# Patient Record
Sex: Male | Born: 1985 | Race: White | Hispanic: No | Marital: Married | Smoking: Never smoker
Health system: Southern US, Community
[De-identification: ages and names within clinical notes are randomized; demographics above are authoritative.]

## PROBLEM LIST (undated history)

## (undated) DIAGNOSIS — R002 Palpitations: Secondary | ICD-10-CM

## (undated) DIAGNOSIS — I10 Essential (primary) hypertension: Secondary | ICD-10-CM

## (undated) DIAGNOSIS — F419 Anxiety disorder, unspecified: Secondary | ICD-10-CM

## (undated) DIAGNOSIS — F32A Depression, unspecified: Secondary | ICD-10-CM

## (undated) DIAGNOSIS — F329 Major depressive disorder, single episode, unspecified: Secondary | ICD-10-CM

## (undated) HISTORY — PX: WISDOM TOOTH EXTRACTION: SHX21

---
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
## 2005-03-28 ENCOUNTER — Ambulatory Visit (HOSPITAL_COMMUNITY): Admission: RE | Admit: 2005-03-28 | Discharge: 2005-03-28 | Payer: Self-pay | Admitting: Orthopedic Surgery

## 2005-03-28 ENCOUNTER — Ambulatory Visit (HOSPITAL_BASED_OUTPATIENT_CLINIC_OR_DEPARTMENT_OTHER): Admission: RE | Admit: 2005-03-28 | Discharge: 2005-03-28 | Payer: Self-pay | Admitting: Orthopedic Surgery

## 2005-07-08 HISTORY — PX: SHOULDER SURGERY: SHX246

## 2012-08-12 ENCOUNTER — Emergency Department: Payer: Self-pay | Admitting: Emergency Medicine

## 2012-08-12 LAB — COMPREHENSIVE METABOLIC PANEL
Anion Gap: 8 (ref 7–16)
BUN: 8 mg/dL (ref 7–18)
Chloride: 104 mmol/L (ref 98–107)
Creatinine: 0.73 mg/dL (ref 0.60–1.30)
Osmolality: 275 (ref 275–301)
Potassium: 3.7 mmol/L (ref 3.5–5.1)
SGOT(AST): 20 U/L (ref 15–37)
SGPT (ALT): 31 U/L (ref 12–78)
Sodium: 138 mmol/L (ref 136–145)
Total Protein: 7.6 g/dL (ref 6.4–8.2)

## 2012-08-12 LAB — URINALYSIS, COMPLETE
Bacteria: NONE SEEN
Bilirubin,UR: NEGATIVE
Blood: NEGATIVE
Glucose,UR: NEGATIVE mg/dL (ref 0–75)
Leukocyte Esterase: NEGATIVE
Nitrite: NEGATIVE
Ph: 7 (ref 4.5–8.0)
RBC,UR: NONE SEEN /HPF (ref 0–5)
Squamous Epithelial: NONE SEEN
WBC UR: NONE SEEN /HPF (ref 0–5)

## 2012-08-12 LAB — CBC
HGB: 14.9 g/dL (ref 13.0–18.0)
MCH: 26.1 pg (ref 26.0–34.0)
MCHC: 33 g/dL (ref 32.0–36.0)
MCV: 79 fL — ABNORMAL LOW (ref 80–100)
Platelet: 213 10*3/uL (ref 150–440)
RDW: 12.7 % (ref 11.5–14.5)

## 2013-08-21 ENCOUNTER — Emergency Department: Payer: Self-pay | Admitting: Emergency Medicine

## 2013-08-21 LAB — COMPREHENSIVE METABOLIC PANEL
ALK PHOS: 84 U/L
ALT: 28 U/L (ref 12–78)
AST: 25 U/L (ref 15–37)
Albumin: 4.7 g/dL (ref 3.4–5.0)
Anion Gap: 7 (ref 7–16)
BUN: 12 mg/dL (ref 7–18)
Bilirubin,Total: 0.4 mg/dL (ref 0.2–1.0)
CALCIUM: 10 mg/dL (ref 8.5–10.1)
CHLORIDE: 103 mmol/L (ref 98–107)
CREATININE: 1.03 mg/dL (ref 0.60–1.30)
Co2: 28 mmol/L (ref 21–32)
EGFR (African American): >60
EGFR (Non-African Amer.): >60
GLUCOSE: 125 mg/dL — AB (ref 65–99)
Osmolality: 277 (ref 275–301)
POTASSIUM: 3.9 mmol/L (ref 3.5–5.1)
Sodium: 138 mmol/L (ref 136–145)
Total Protein: 8.3 g/dL — ABNORMAL HIGH (ref 6.4–8.2)

## 2013-08-21 LAB — DRUG SCREEN, URINE
Amphetamines, Ur Screen: NEGATIVE (ref ?–1000)
BARBITURATES, UR SCREEN: NEGATIVE (ref ?–200)
Benzodiazepine, Ur Scrn: NEGATIVE (ref ?–200)
CANNABINOID 50 NG, UR ~~LOC~~: NEGATIVE (ref ?–50)
COCAINE METABOLITE, UR ~~LOC~~: NEGATIVE (ref ?–300)
MDMA (Ecstasy)Ur Screen: NEGATIVE (ref ?–500)
Methadone, Ur Screen: NEGATIVE (ref ?–300)
Opiate, Ur Screen: NEGATIVE (ref ?–300)
Phencyclidine (PCP) Ur S: NEGATIVE (ref ?–25)
Tricyclic, Ur Screen: NEGATIVE (ref ?–1000)

## 2013-08-21 LAB — CBC
HCT: 45.3 % (ref 40.0–52.0)
HGB: 15.4 g/dL (ref 13.0–18.0)
MCH: 26.8 pg (ref 26.0–34.0)
MCHC: 33.9 g/dL (ref 32.0–36.0)
MCV: 79 fL — ABNORMAL LOW (ref 80–100)
Platelet: 222 10*3/uL (ref 150–440)
RBC: 5.75 10*6/uL (ref 4.40–5.90)
RDW: 13.2 % (ref 11.5–14.5)
WBC: 9.2 10*3/uL (ref 3.8–10.6)

## 2013-08-21 LAB — TSH: Thyroid Stimulating Horm: 2.22 u[IU]/mL

## 2013-08-21 LAB — T4, FREE: FREE THYROXINE: 0.87 ng/dL (ref 0.76–1.46)

## 2013-08-21 LAB — TROPONIN I

## 2013-12-06 ENCOUNTER — Emergency Department (HOSPITAL_BASED_OUTPATIENT_CLINIC_OR_DEPARTMENT_OTHER): Payer: Self-pay

## 2013-12-06 ENCOUNTER — Emergency Department (HOSPITAL_BASED_OUTPATIENT_CLINIC_OR_DEPARTMENT_OTHER)
Admission: EM | Admit: 2013-12-06 | Discharge: 2013-12-06 | Disposition: A | Discharge status: Home / Self Care | Payer: Self-pay | Attending: Emergency Medicine | Admitting: Emergency Medicine

## 2013-12-06 ENCOUNTER — Encounter (HOSPITAL_BASED_OUTPATIENT_CLINIC_OR_DEPARTMENT_OTHER): Payer: Self-pay | Admitting: Emergency Medicine

## 2013-12-06 DIAGNOSIS — Y990 Civilian activity done for income or pay: Secondary | ICD-10-CM | POA: Insufficient documentation

## 2013-12-06 DIAGNOSIS — Y9389 Activity, other specified: Secondary | ICD-10-CM | POA: Insufficient documentation

## 2013-12-06 DIAGNOSIS — F3289 Other specified depressive episodes: Secondary | ICD-10-CM | POA: Insufficient documentation

## 2013-12-06 DIAGNOSIS — Y9289 Other specified places as the place of occurrence of the external cause: Secondary | ICD-10-CM | POA: Insufficient documentation

## 2013-12-06 DIAGNOSIS — S39012A Strain of muscle, fascia and tendon of lower back, initial encounter: Secondary | ICD-10-CM

## 2013-12-06 DIAGNOSIS — S335XXA Sprain of ligaments of lumbar spine, initial encounter: Secondary | ICD-10-CM | POA: Insufficient documentation

## 2013-12-06 DIAGNOSIS — X500XXA Overexertion from strenuous movement or load, initial encounter: Secondary | ICD-10-CM | POA: Insufficient documentation

## 2013-12-06 DIAGNOSIS — Z79899 Other long term (current) drug therapy: Secondary | ICD-10-CM | POA: Insufficient documentation

## 2013-12-06 DIAGNOSIS — F329 Major depressive disorder, single episode, unspecified: Secondary | ICD-10-CM | POA: Insufficient documentation

## 2013-12-06 HISTORY — DX: Depression, unspecified: F32.A

## 2013-12-06 HISTORY — DX: Major depressive disorder, single episode, unspecified: F32.9

## 2013-12-06 MED ORDER — HYDROCODONE-ACETAMINOPHEN 5-325 MG PO TABS: 1.0000 | ORAL_TABLET | ORAL | Status: DC | PRN

## 2013-12-06 MED ORDER — CYCLOBENZAPRINE HCL 5 MG PO TABS: 5.0000 mg | ORAL_TABLET | Freq: Two times a day (BID) | ORAL | Status: DC | PRN

## 2013-12-06 NOTE — Discharge Instructions (Signed)
Back Pain, Adult  Back pain is very common. The pain often gets better over time. The cause of back pain is usually not dangerous. Most people can learn to manage their back pain on their own.   HOME CARE   · Stay active. Start with short walks on flat ground if you can. Try to walk farther each day.  · Do not sit, drive, or stand in one place for more than 30 minutes. Do not stay in bed.  · Do not avoid exercise or work. Activity can help your back heal faster.  · Be careful when you bend or lift an object. Bend at your knees, keep the object close to you, and do not twist.  · Sleep on a firm mattress. Lie on your side, and bend your knees. If you lie on your back, put a pillow under your knees.  · Only take medicines as told by your doctor.  · Put ice on the injured area.  · Put ice in a plastic bag.  · Place a towel between your skin and the bag.  · Leave the ice on for 15-20 minutes, 03-04 times a day for the first 2 to 3 days. After that, you can switch between ice and heat packs.  · Ask your doctor about back exercises or massage.  · Avoid feeling anxious or stressed. Find good ways to deal with stress, such as exercise.  GET HELP RIGHT AWAY IF:   · Your pain does not go away with rest or medicine.  · Your pain does not go away in 1 week.  · You have new problems.  · You do not feel well.  · The pain spreads into your legs.  · You cannot control when you poop (bowel movement) or pee (urinate).  · Your arms or legs feel weak or lose feeling (numbness).  · You feel sick to your stomach (nauseous) or throw up (vomit).  · You have belly (abdominal) pain.  · You feel like you may pass out (faint).  MAKE SURE YOU:   · Understand these instructions.  · Will watch your condition.  · Will get help right away if you are not doing well or get worse.  Document Released: 12/11/2007 Document Revised: 09/16/2011 Document Reviewed: 11/12/2010  [employer]® Patient Information ©2014 [employer].

## 2013-12-06 NOTE — ED Provider Notes (Signed)
CSN: [id number]     Arrival date & time 12/06/13  1024 History   First MD Initiated Contact with Patient 12/06/13 1029     Chief Complaint  Patient presents with  . Back Pain     (Consider location/radiation/quality/duration/timing/severity/associated sxs/prior Treatment) HPI Comments: Pt c/o lower back pain after lifting a box and rotating while at work today. Denies numbness or weakness. No previous history of back pain. Hasn't taken anything. Pain is worse with movement. Hasn't taken anything for pain  The history is provided by the patient. No language interpreter was used.    Past Medical History  Diagnosis Date  . Depression    Past Surgical History  Procedure Laterality Date  . Shoulder surgery     History reviewed. No pertinent family history. History  Substance Use Topics  . Smoking status: Never Smoker   . Smokeless tobacco: Not on file  . Alcohol Use: No    Review of Systems  Constitutional: Negative.   Respiratory: Negative.   Cardiovascular: Negative.       Allergies  Review of patient's allergies indicates no known allergies.  Home Medications   Prior to Admission medications   Medication Sig Start Date End Date Taking? Authorizing Provider  ALPRAZolam Prudy Feeler) 1 MG tablet Take 1 mg by mouth at bedtime as needed for anxiety.   Yes Historical Provider, MD  PARoxetine (PAXIL) 10 MG tablet Take 10 mg by mouth daily.   Yes Historical Provider, MD   BP 140/73  Pulse 80  Temp(Src) 99.2 F (37.3 C) (Oral)  Resp 18  Ht 5\' 9"  (1.753 m)  Wt 260 lb (117.935 kg)  BMI 38.38 kg/m2 Physical Exam  Nursing note and vitals reviewed. Constitutional: He is oriented to person, place, and time. He appears well-developed and well-nourished.  Cardiovascular: Normal rate and regular rhythm.   Pulmonary/Chest: Effort normal and breath sounds normal.  Musculoskeletal:       Lumbar back: He exhibits tenderness and bony tenderness. He exhibits no deformity.   Neurological: He is alert and oriented to person, place, and time. Coordination normal.  Skin: Skin is warm and dry.  Psychiatric: He has a normal mood and affect.    ED Course  Procedures (including critical care time) Labs Review Labs Reviewed - No data to display  Imaging Review Dg Lumbar Spine Complete  12/06/2013   CLINICAL DATA:  Twisting injury with severe mid and low back pain.  EXAM: LUMBAR SPINE - COMPLETE 4+ VIEW  COMPARISON:  None.  FINDINGS: There is straightening of the normal lumbar lordosis without subluxation or fracture. Alignment is otherwise anatomic. Vertebral body and disc space height are maintained. No pars defects. No degenerative changes.  IMPRESSION: Straightening of the normal lumbar lordosis. No fracture or subluxation.   Electronically Signed   By: Leanna Battles M.D.   On: 12/06/2013 11:27     EKG Interpretation None      MDM   Final diagnoses:  Lumbar strain    Likely strain. Pt is neurologically intact. Will treat symptomatically with hydrocodone and flexeril    Teressa Lower, NP 12/06/13 1145

## 2013-12-06 NOTE — ED Notes (Signed)
Pt reports that he picked up a box at work and rotated.  Reports lower-mid back pain since that time.

## 2013-12-09 ENCOUNTER — Other Ambulatory Visit: Payer: Self-pay | Admitting: Sports Medicine

## 2013-12-09 DIAGNOSIS — M545 Low back pain, unspecified: Secondary | ICD-10-CM

## 2013-12-09 NOTE — ED Provider Notes (Signed)
Medical screening examination/treatment/procedure(s) were performed by non-physician practitioner and as supervising physician I was immediately available for consultation/collaboration.   EKG Interpretation None        Candyce Churn III, MD 12/09/13 1113

## 2013-12-18 ENCOUNTER — Other Ambulatory Visit: Payer: Self-pay

## 2015-12-13 IMAGING — CR DG LUMBAR SPINE COMPLETE 4+V
5 series · 5 of 5 positions shown · non-contrast
Comparison: None.

CLINICAL DATA: Twisting injury with severe mid and low back pain.

EXAM:
LUMBAR SPINE - COMPLETE 4+ VIEW

[t l-spine a.p.]
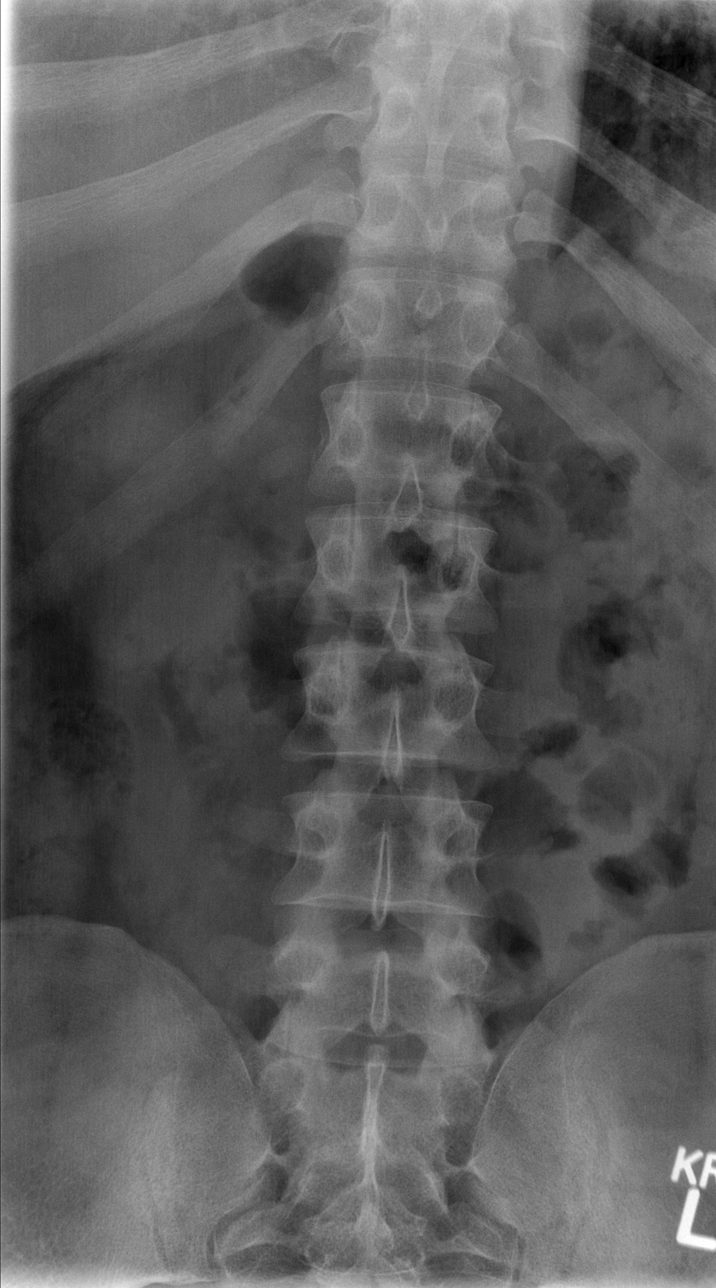

[t l-spine oblique exposure (1 of 2)]
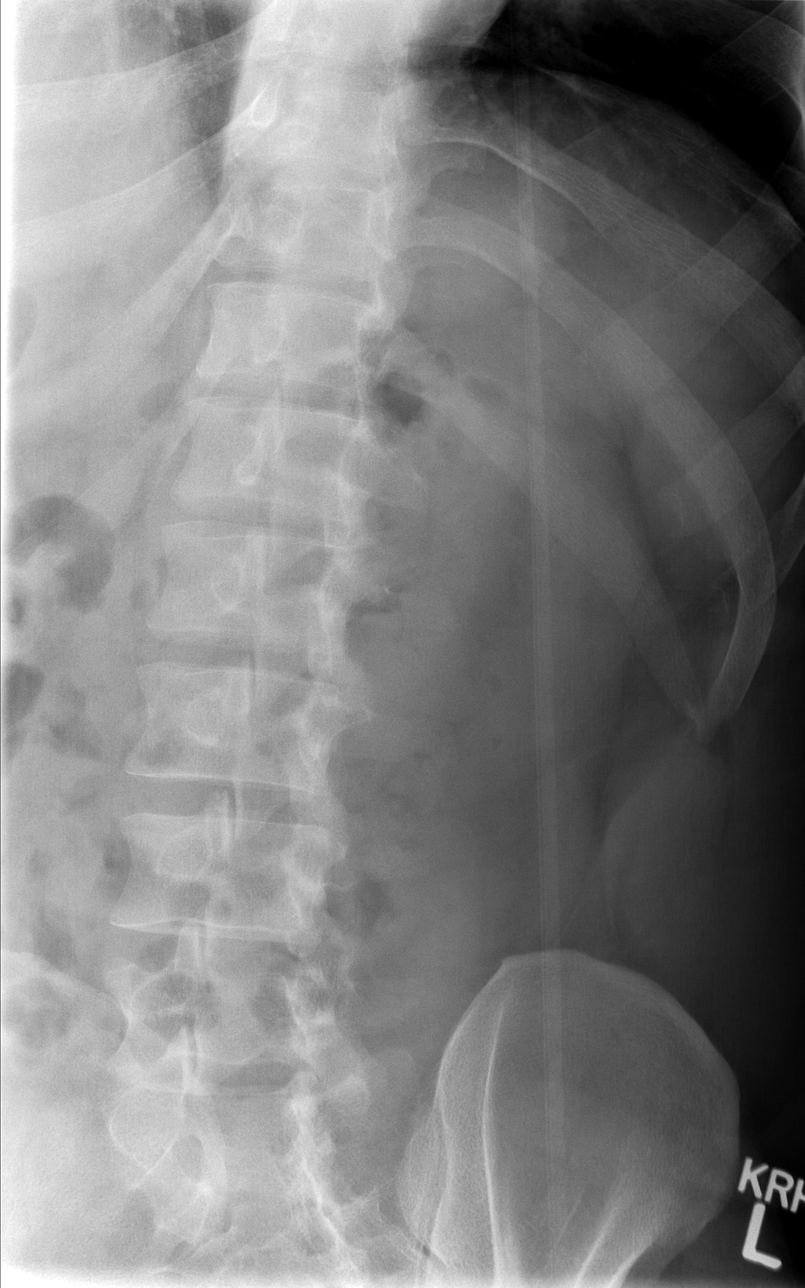

[t l-spine oblique exposure (2 of 2)]
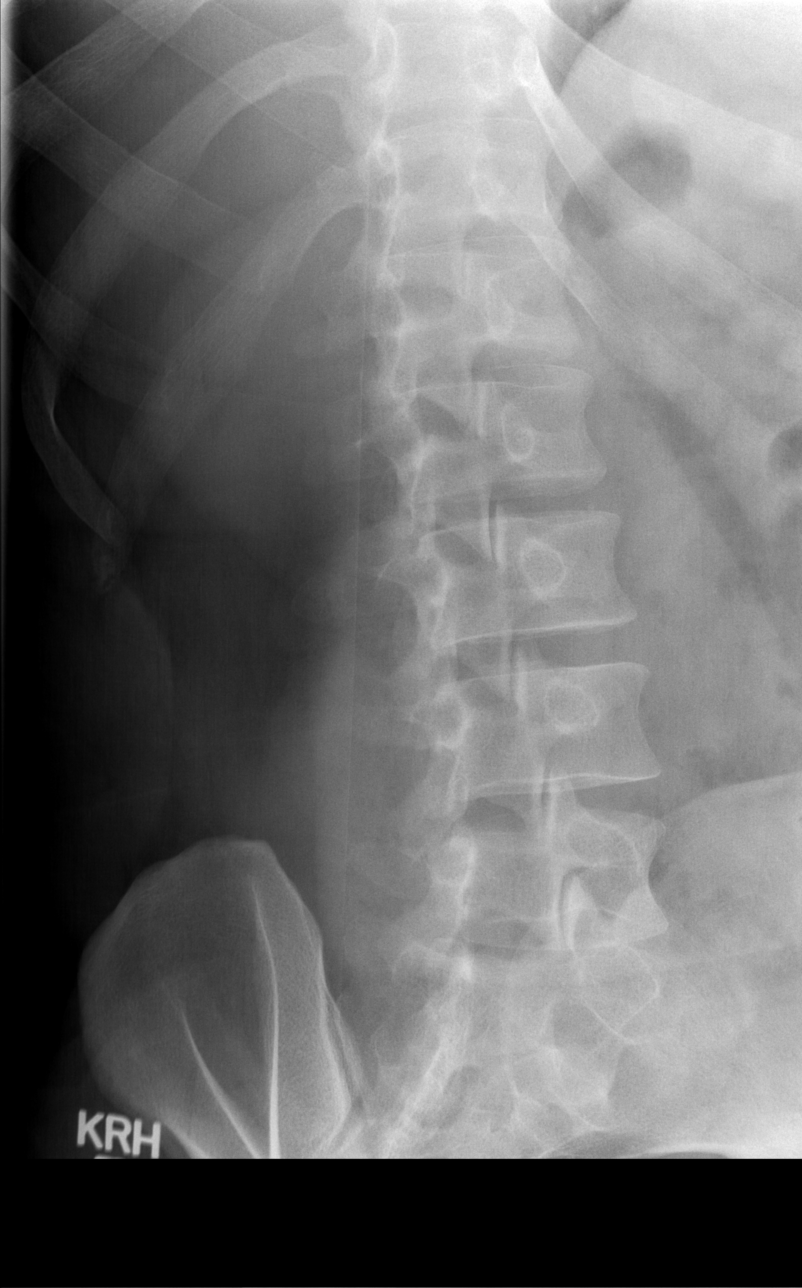

[t l-spine lat]
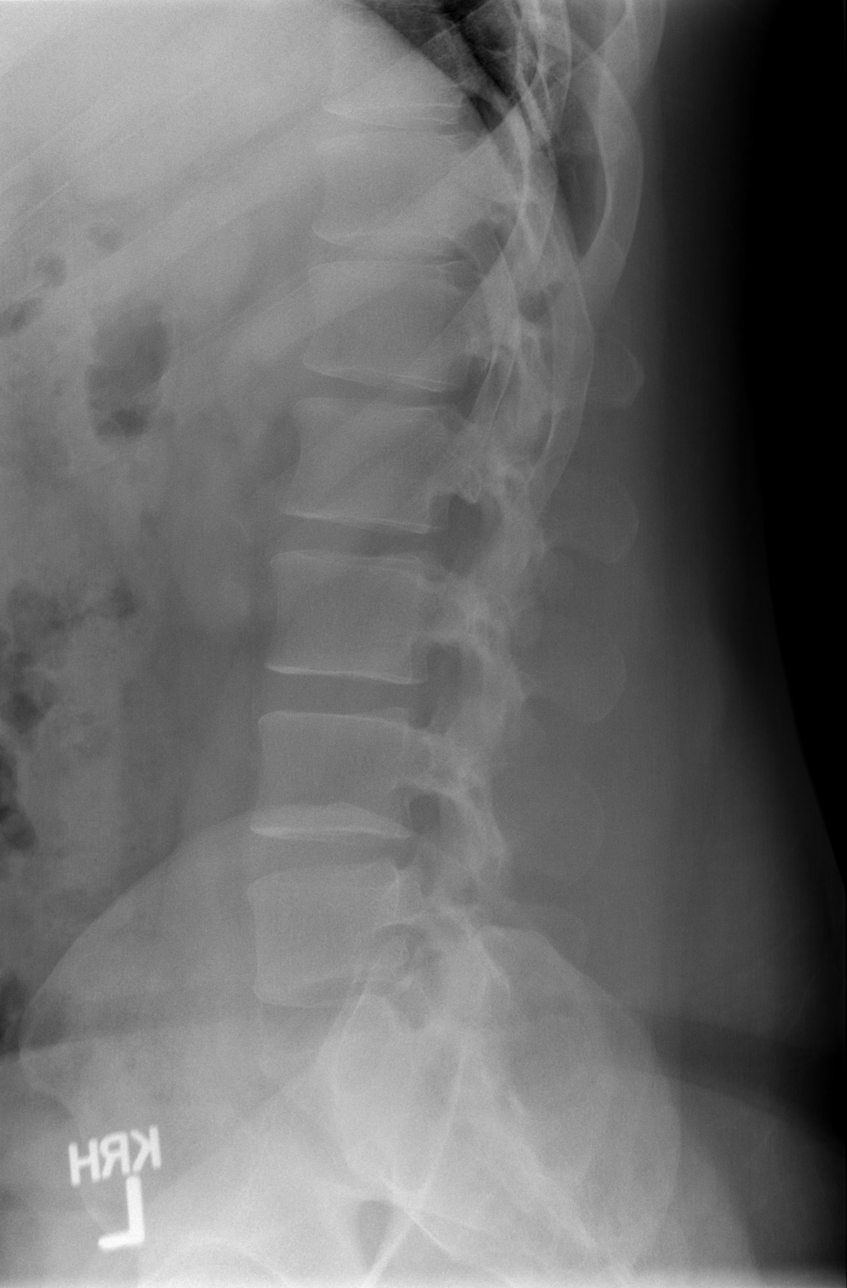

[t l-spine l5-s1 spot]
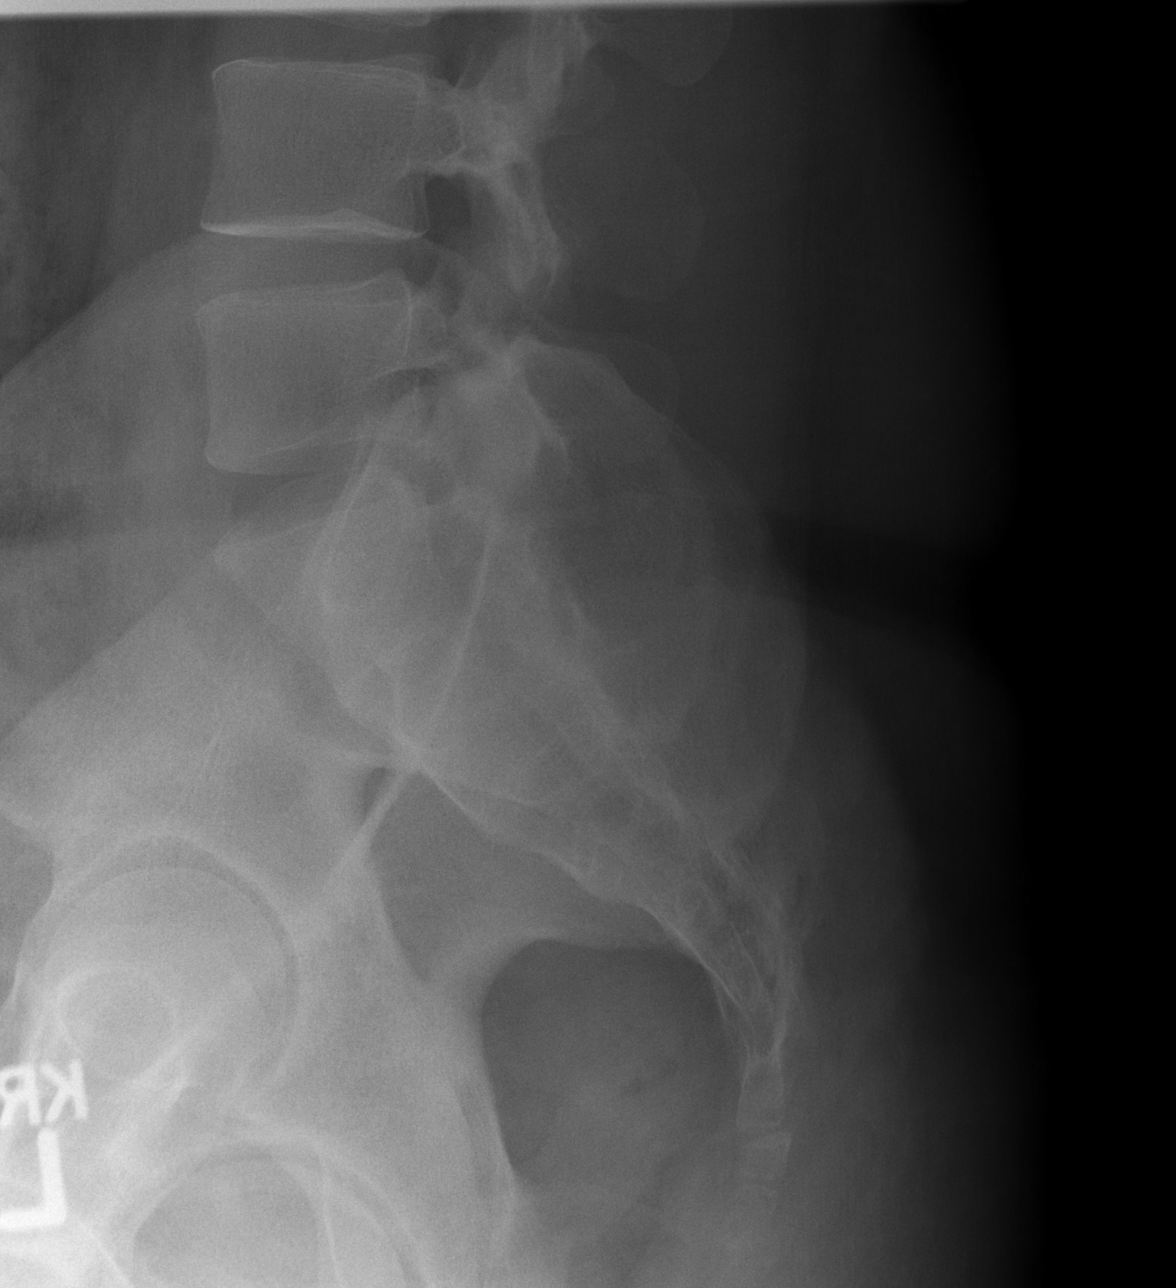

[5 of 5 positions shown; findings below may reference images not displayed]

FINDINGS: There is straightening of the normal lumbar lordosis without
subluxation or fracture. Alignment is otherwise anatomic. Vertebral
body and disc space height are maintained. No pars defects. No
degenerative changes.
IMPRESSION: Straightening of the normal lumbar lordosis. No fracture or
subluxation.

## 2016-08-23 ENCOUNTER — Encounter: Payer: Self-pay | Admitting: *Deleted

## 2016-08-28 ENCOUNTER — Ambulatory Visit: Payer: Self-pay | Admitting: Anesthesiology

## 2016-08-28 ENCOUNTER — Encounter
Admission: RE | Disposition: A | Discharge status: Home / Self Care | Payer: Self-pay | Source: Ambulatory Visit | Attending: Unknown Physician Specialty

## 2016-08-28 ENCOUNTER — Ambulatory Visit
Admission: RE | Admit: 2016-08-28 | Discharge: 2016-08-28 | Disposition: A | Discharge status: Home / Self Care | Payer: Self-pay | Source: Ambulatory Visit | Attending: Unknown Physician Specialty | Admitting: Unknown Physician Specialty

## 2016-08-28 DIAGNOSIS — S62316A Displaced fracture of base of fifth metacarpal bone, right hand, initial encounter for closed fracture: Secondary | ICD-10-CM | POA: Insufficient documentation

## 2016-08-28 DIAGNOSIS — I10 Essential (primary) hypertension: Secondary | ICD-10-CM | POA: Insufficient documentation

## 2016-08-28 DIAGNOSIS — X58XXXA Exposure to other specified factors, initial encounter: Secondary | ICD-10-CM | POA: Insufficient documentation

## 2016-08-28 HISTORY — PX: OPEN REDUCTION INTERNAL FIXATION (ORIF) HAND: SHX5991

## 2016-08-28 HISTORY — DX: Anxiety disorder, unspecified: F41.9

## 2016-08-28 HISTORY — DX: Palpitations: R00.2

## 2016-08-28 HISTORY — DX: Essential (primary) hypertension: I10

## 2016-08-28 SURGERY — OPEN REDUCTION INTERNAL FIXATION (ORIF) HAND
Anesthesia: Regional | Laterality: Right | Wound class: Clean

## 2016-08-28 MED ORDER — ROPIVACAINE HCL 5 MG/ML IJ SOLN
INTRAMUSCULAR | Status: DC | PRN
  Administered 2016-08-28: 35 mL via EPIDURAL

## 2016-08-28 MED ORDER — OXYCODONE HCL 5 MG PO TABS: 5.0000 mg | ORAL_TABLET | Freq: Once | ORAL | Status: DC | PRN

## 2016-08-28 MED ORDER — MIDAZOLAM HCL 2 MG/2ML IJ SOLN
INTRAMUSCULAR | Status: DC | PRN
  Administered 2016-08-28 (×2): 2 mg via INTRAVENOUS

## 2016-08-28 MED ORDER — NORCO 5-325 MG PO TABS: 1.0000 | ORAL_TABLET | Freq: Four times a day (QID) | ORAL | 0 refills | Status: AC | PRN

## 2016-08-28 MED ORDER — LIDOCAINE HCL (CARDIAC) 20 MG/ML IV SOLN
INTRAVENOUS | Status: DC | PRN
  Administered 2016-08-28: 50 mg via INTRAVENOUS

## 2016-08-28 MED ORDER — PROPOFOL 10 MG/ML IV BOLUS
INTRAVENOUS | Status: DC | PRN
  Administered 2016-08-28: 40 mg via INTRAVENOUS
  Administered 2016-08-28: 50 mg via INTRAVENOUS
  Administered 2016-08-28: 40 mg via INTRAVENOUS

## 2016-08-28 MED ORDER — CEFAZOLIN SODIUM 1 G IJ SOLR
INTRAMUSCULAR | Status: DC | PRN
  Administered 2016-08-28: 2 g via INTRAMUSCULAR

## 2016-08-28 MED ORDER — FENTANYL CITRATE (PF) 100 MCG/2ML IJ SOLN
INTRAMUSCULAR | Status: DC | PRN
  Administered 2016-08-28: 100 ug via INTRAVENOUS
  Administered 2016-08-28 (×4): 25 ug via INTRAVENOUS

## 2016-08-28 MED ORDER — LACTATED RINGERS IV SOLN
INTRAVENOUS | Status: DC
  Administered 2016-08-28: 11:00:00 via INTRAVENOUS

## 2016-08-28 MED ORDER — OXYCODONE HCL 5 MG/5ML PO SOLN
5.0000 mg | Freq: Once | ORAL | Status: DC | PRN
Start: 2016-08-28 — End: 2016-08-28

## 2016-08-28 MED ORDER — PROPOFOL 500 MG/50ML IV EMUL
INTRAVENOUS | Status: DC | PRN
  Administered 2016-08-28: 100 ug/kg/min via INTRAVENOUS

## 2016-08-28 SURGICAL SUPPLY — 30 items
BANDAGE ELASTIC 2 LF NS (GAUZE/BANDAGES/DRESSINGS) ×3 IMPLANT
BANDAGE ELASTIC 3 LF NS (GAUZE/BANDAGES/DRESSINGS) IMPLANT
BNDG CMPR MED 5X2 ELC HKLP NS (GAUZE/BANDAGES/DRESSINGS) ×1
BNDG CMPR MED 5X3 ELC HKLP NS (GAUZE/BANDAGES/DRESSINGS)
BNDG ESMARK 4X12 TAN STRL LF (GAUZE/BANDAGES/DRESSINGS) ×3 IMPLANT
CANISTER SUCT 1200ML W/VALVE (MISCELLANEOUS) ×3 IMPLANT
CUFF TOURN SGL QUICK 18 (TOURNIQUET CUFF) ×3 IMPLANT
DRAPE FLUOR MINI C-ARM 54X84 (DRAPES) ×3 IMPLANT
DRAPE INCISE IOBAN 66X45 STRL (DRAPES) ×1 IMPLANT
DURAPREP 26ML APPLICATOR (WOUND CARE) ×3 IMPLANT
ELECT REM PT RETURN 9FT ADLT (ELECTROSURGICAL) ×3
ELECTRODE REM PT RTRN 9FT ADLT (ELECTROSURGICAL) ×1 IMPLANT
GAUZE SPONGE 4X4 12PLY STRL (GAUZE/BANDAGES/DRESSINGS) ×3 IMPLANT
GLOVE BIO SURGEON STRL SZ8 (GLOVE) ×3 IMPLANT
GLOVE BIOGEL M STRL SZ7.5 (GLOVE) ×3 IMPLANT
GLOVE INDICATOR 8.0 STRL GRN (GLOVE) ×3 IMPLANT
GOWN STRL REUS W/ TWL LRG LVL3 (GOWN DISPOSABLE) ×2 IMPLANT
GOWN STRL REUS W/TWL LRG LVL3 (GOWN DISPOSABLE) ×6
KIT ROOM TURNOVER OR (KITS) ×3 IMPLANT
LOOPS WHITE MINI 1.3MMX.09MM (MISCELLANEOUS) ×1 IMPLANT
NS IRRIG 500ML POUR BTL (IV SOLUTION) ×3 IMPLANT
PACK EXTREMITY ARMC (MISCELLANEOUS) ×3 IMPLANT
PADDING CAST 2X4YD ST (MISCELLANEOUS) ×4
PADDING CAST BLEND 2X4 STRL (MISCELLANEOUS) ×2 IMPLANT
SPLINT CAST 1 STEP 3X12 (MISCELLANEOUS) ×3 IMPLANT
STOCKINETTE 4X48 STRL (DRAPES) ×3 IMPLANT
SUT ETHILON 4-0 (SUTURE) ×3
SUT ETHILON 4-0 FS2 18XMFL BLK (SUTURE) ×1
SUTURE ETHLN 4-0 FS2 18XMF BLK (SUTURE) ×1 IMPLANT
SYSTEM FIXATION BONE (Anchor) ×2 IMPLANT

## 2016-08-28 NOTE — Anesthesia Preprocedure Evaluation (Addendum)
Anesthesia Evaluation  Patient identified by MRN, date of birth, ID band Patient awake    Reviewed: Allergy & Precautions, H&P , NPO status , Patient's Chart, lab work & pertinent test results  Airway Mallampati: II  TM Distance: >3 FB Neck ROM: full    Dental no notable dental hx.    Pulmonary neg pulmonary ROS,    Pulmonary exam normal        Cardiovascular hypertension, On Medications Normal cardiovascular exam     Neuro/Psych    GI/Hepatic negative GI ROS, Neg liver ROS,   Endo/Other  negative endocrine ROS  Renal/GU negative Renal ROS     Musculoskeletal   Abdominal   Peds  Hematology negative hematology ROS (+)   Anesthesia Other Findings   Reproductive/Obstetrics                            Anesthesia Physical Anesthesia Plan  ASA: II  Anesthesia Plan: Regional   Post-op Pain Management:    Induction:   Airway Management Planned:   Additional Equipment:   Intra-op Plan:   Post-operative Plan:   Informed Consent: I have reviewed the patients History and Physical, chart, labs and discussed the procedure including the risks, benefits and alternatives for the proposed anesthesia with the patient or authorized representative who has indicated his/her understanding and acceptance.     Plan Discussed with:   Anesthesia Plan Comments:        Anesthesia Quick Evaluation

## 2016-08-28 NOTE — Op Note (Signed)
08/28/2016  12:16 PM  PATIENT:  Alexander Barrett  31 y.o. male  PRE-OPERATIVE DIAGNOSIS:  CLOSED DISPLACED FRACTURE OF BASE OF FIFTH METACARPAL BONE OF RIGHT HAND  POST-OPERATIVE DIAGNOSIS:  CLOSED DISPLACED FRACTURE OF BASE OF FIFTH METACARPAL BONE OF RIGHT HAND  PROCEDURE:  Procedure(s): Closed REDUCTION INTERNAL FIXATION (ORIF) HAND OF FIFTH METACARPAL  (Right)  SURGEON:   Isidoro DonningHarold Kathyann Spaugh, Jr., MD  ANESTHESIA:  supraclavicular block right upper extremity  IMPLANTS: Locked flexible intramedullary nail  HISTORY: Patient recently injured his right hand at work. X-rays revealed a displaced base of the right fifth metacarpal fracture. Patient was brought in for closed reduction and percutaneous fixation of the fracture.  OP NOTE: Patient had a supraclavicular block in the preop area. He was taken to the operating room where a tourniquet was applied to his right upper arm. The right upper extremity was prepped and draped in usual fashion for a procedure about the hand. The patient was given 2 g of IV Kefzol prior to the start of the procedure. Next a small puncture wound was made along the radial border of the proximal portion of the distal fifth metacarpal fracture fragment. A hemostat was inserted to help reduce the fracture fragments. Then I percutaneously pinned the fracture with a locked flexible intramedullary nail. The nail measured 1.6 mm in diameter. The nail was inserted into the base of the proximal fracture fragment through a small puncture wound and then under fluoroscopy was pushed into the distal fracture fragment. The fracture seemed to be reasonably well reduced at this time. I then inserted a locking device over the proximal portion of the pin and drove it into the proximal fracture fragment. The locking device and pin were then cut about 8-10 mm outside the skin. A protective cap was placed over the tip of the pin. The 2 small puncture wounds were closed with 4-0 nylon sutures in  vertical mattress fashion. Sterile dressing was applied along with an ulnar gutter splint that did capture the fourth and fifth fingers. The patient was then transferred to a stretcher bed and taken to the recovery room in satisfactory condition. The tourniquet, incidentally, was not inflated during the course of the procedure.

## 2016-08-28 NOTE — Discharge Instructions (Signed)
General Anesthesia, Adult, Care After These instructions provide you with information about caring for yourself after your procedure. Your health care provider may also give you more specific instructions. Your treatment has been planned according to current medical practices, but problems sometimes occur. Call your health care provider if you have any problems or questions after your procedure. What can I expect after the procedure? After the procedure, it is common to have:  Vomiting.  A sore throat.  Mental slowness. It is common to feel:  Nauseous.  Cold or shivery.  Sleepy.  Ice pack  Elevation  RTC in about 2 weeks  Tired.  Sore or achy, even in parts of your body where you did not have surgery. Follow these instructions at home: For at least 24 hours after the procedure:  Do not:  Participate in activities where you could fall or become injured.  Drive.  Use heavy machinery.  Drink alcohol.  Take sleeping pills or medicines that cause drowsiness.  Make important decisions or sign legal documents.  Take care of children on your own.  Rest. Eating and drinking  If you vomit, drink water, juice, or soup when you can drink without vomiting.  Drink enough fluid to keep your urine clear or pale yellow.  Make sure you have little or no nausea before eating solid foods.  Follow the diet recommended by your health care provider. General instructions  Have a responsible adult stay with you until you are awake and alert.  Return to your normal activities as told by your health care provider. Ask your health care provider what activities are safe for you.  Take over-the-counter and prescription medicines only as told by your health care provider.  If you smoke, do not smoke without supervision.  Keep all follow-up visits as told by your health care provider. This is important. Contact a health care provider if:  You continue to have nausea or vomiting  at home, and medicines are not helpful.  You cannot drink fluids or start eating again.  You cannot urinate after 8-12 hours.  You develop a skin rash.  You have fever.  You have increasing redness at the site of your procedure. Get help right away if:  You have difficulty breathing.  You have chest pain.  You have unexpected bleeding.  You feel that you are having a life-threatening or urgent problem. This information is not intended to replace advice given to you by your health care provider. Make sure you discuss any questions you have with your health care provider. Document Released: 09/30/2000 Document Revised: 11/27/2015 Document Reviewed: 06/08/2015 Elsevier Interactive Patient Education  2017 [employer]

## 2016-08-28 NOTE — Anesthesia Procedure Notes (Signed)
Anesthesia Regional Block: Supraclavicular block   Pre-Anesthetic Checklist: ,, timeout performed, Correct Patient, Correct Site, Correct Laterality, Correct Procedure, Correct Position, site marked, Risks and benefits discussed,  Surgical consent,  Pre-op evaluation,  At surgeon's request and post-op pain management  Laterality: Right  Prep: chloraprep       Needles:  Injection technique: Single-shot  Needle Type: Echogenic Stimulator Needle      Needle Gauge: 21     Additional Needles:   Procedures: ultrasound guided, nerve stimulator,,,,,,  Narrative:  Start time: 08/28/2016 10:20 AM End time: 08/28/2016 10:30 AM Injection made incrementally with aspirations every 5 mL.  Performed by: Personally  Anesthesiologist: Jolayne PantherUNKLE, Shereece Wellborn  Additional Notes: Functioning IV was confirmed and monitors applied.  Sterile prep and drape,hand hygiene and sterile gloves were used.Ultrasound guidance: relevant anatomy identified, needle position confirmed, local anesthetic spread visualized around nerve(s)., vascular puncture avoided.  Image printed for medical record.  Negative aspiration and negative test dose prior to incremental administration of local anesthetic. The patient tolerated the procedure well. Vitals signes recorded in RN notes.  Total 35 ml 0.5% Ropivacaine with 1:400 k epi.

## 2016-08-28 NOTE — Anesthesia Postprocedure Evaluation (Signed)
Anesthesia Post Note  Patient: Alexander Barrett  Procedure(s) Performed: Procedure(s) (LRB): Closed REDUCTION INTERNAL FIXATION (ORIF) HAND OF FIFTH METACARPAL  (Right)  Patient location during evaluation: PACU Anesthesia Type: Regional Level of consciousness: awake and alert Pain management: pain level controlled Vital Signs Assessment: post-procedure vital signs reviewed and stable Respiratory status: spontaneous breathing Cardiovascular status: blood pressure returned to baseline Postop Assessment: no headache Anesthetic complications: no    Alexander Barrett, Alexander Barrett,  Alexander Barrett

## 2016-08-28 NOTE — H&P (Signed)
  H and P reviewed. No changes. Uploaded at later date. 

## 2016-08-28 NOTE — Transfer of Care (Signed)
Immediate Anesthesia Transfer of Care Note  Patient: Alexander Barrett  Procedure(s) Performed: Procedure(s): Closed REDUCTION INTERNAL FIXATION (ORIF) HAND OF FIFTH METACARPAL  (Right)  Patient Location: PACU  Anesthesia Type: Regional  Level of Consciousness: awake, alert  and patient cooperative  Airway and Oxygen Therapy: Patient Spontanous Breathing and Patient connected to supplemental oxygen  Post-op Assessment: Post-op Vital signs reviewed, Patient's Cardiovascular Status Stable, Respiratory Function Stable, Patent Airway and No signs of Nausea or vomiting  Post-op Vital Signs: Reviewed and stable  Complications: No apparent anesthesia complications

## 2016-08-28 NOTE — Anesthesia Procedure Notes (Signed)
Procedure Name: MAC Date/Time: 08/28/2016 10:40 AM Performed by: Cameron Ali Pre-anesthesia Checklist: Patient identified, Emergency Drugs available, Suction available, Patient being monitored and Timeout performed Patient Re-evaluated:Patient Re-evaluated prior to inductionOxygen Delivery Method: Simple face mask Placement Confirmation: positive ETCO2 and breath sounds checked- equal and bilateral

## 2016-08-29 ENCOUNTER — Encounter: Payer: Self-pay | Admitting: Unknown Physician Specialty

## 2019-05-19 ENCOUNTER — Other Ambulatory Visit: Payer: Self-pay

## 2019-05-19 DIAGNOSIS — Z20822 Contact with and (suspected) exposure to covid-19: Secondary | ICD-10-CM

## 2019-05-22 LAB — NOVEL CORONAVIRUS, NAA: SARS-CoV-2, NAA: NOT DETECTED
